# Patient Record
Sex: Male | Born: 1975 | Race: White | Hispanic: No | Marital: Married | Smoking: Never smoker
Health system: Southern US, Community
[De-identification: ages and names within clinical notes are randomized; demographics above are authoritative.]

## PROBLEM LIST (undated history)

## (undated) DIAGNOSIS — K5792 Diverticulitis of intestine, part unspecified, without perforation or abscess without bleeding: Secondary | ICD-10-CM

## (undated) HISTORY — PX: HERNIA REPAIR: SHX51

---

## 2005-05-03 ENCOUNTER — Ambulatory Visit: Payer: Self-pay | Admitting: General Surgery

## 2009-06-15 ENCOUNTER — Ambulatory Visit: Payer: Self-pay | Admitting: Otolaryngology

## 2009-06-29 ENCOUNTER — Ambulatory Visit: Payer: Self-pay | Admitting: Otolaryngology

## 2010-04-27 DEATH — deceased

## 2011-03-26 IMAGING — CT CT NECK WITHOUT AND WITH CONTRAST
2 of 3 series · 8 of 14 positions shown, 9 images · non-contrast
Comparison: none

REASON FOR EXAM: LEFT SUBMANDIBULAR GLAND STONE
COMMENTS:

[Series 2: soft tissue · axial · 0.51mm/px · z∈[-239,-50]mm · 4 of 105 slices shown, 5 images (1 of 2)]
[im 21/105  soft-tissue]
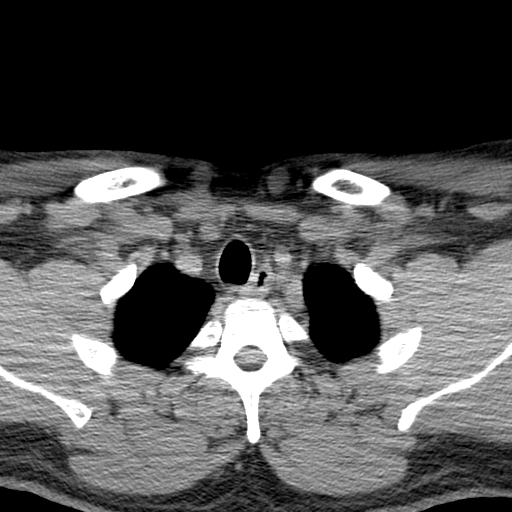
[im 21/105  bone]
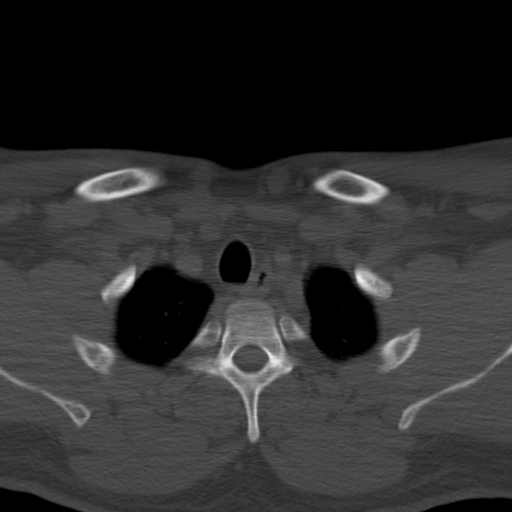
[im 42/105  bone]
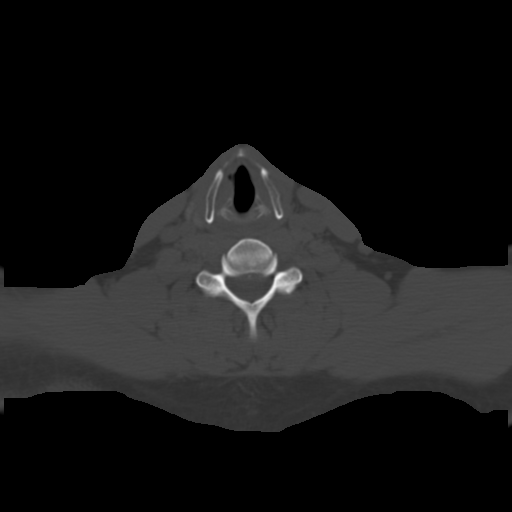
[im 63/105  bone]
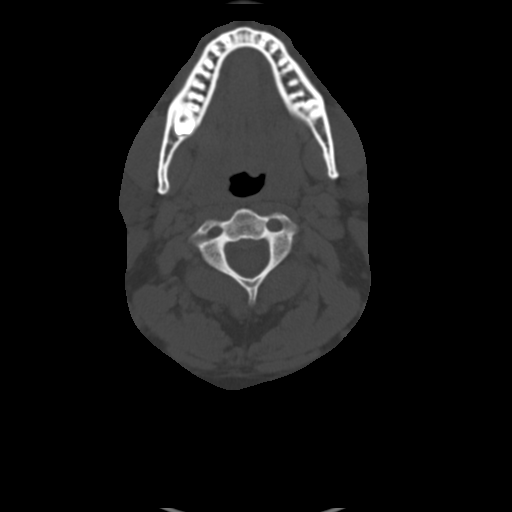
[im 84/105  bone]
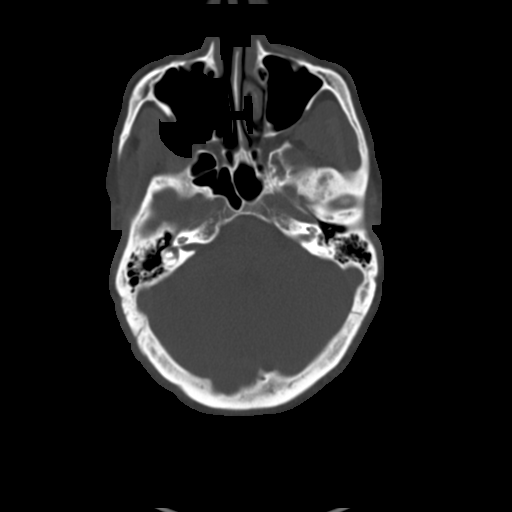

[Series 3: soft tissue · axial · 0.51mm/px · z∈[-239,-50]mm · 4 of 105 slices shown (2 of 2)]
[im 21/105  soft-tissue]
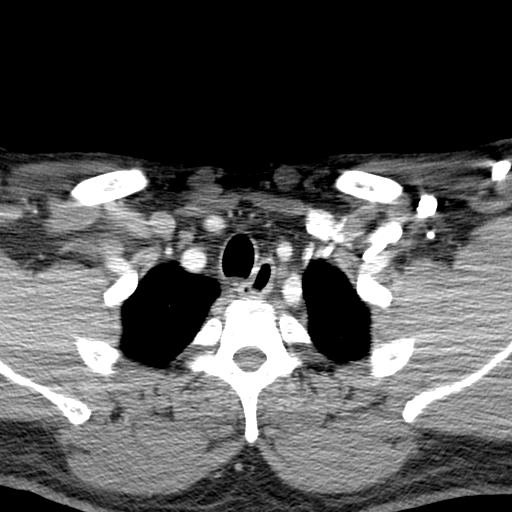
[im 42/105  soft-tissue]
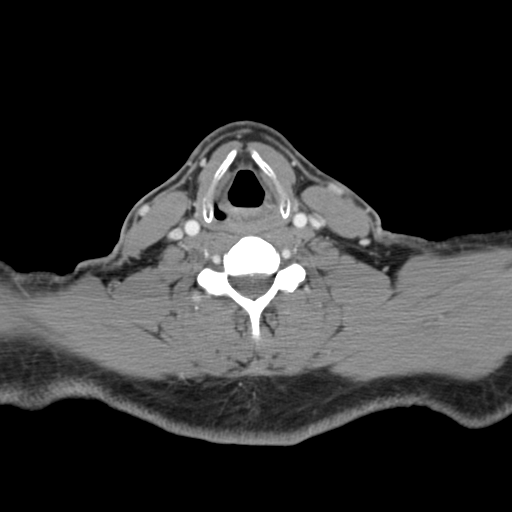
[im 63/105  soft-tissue]
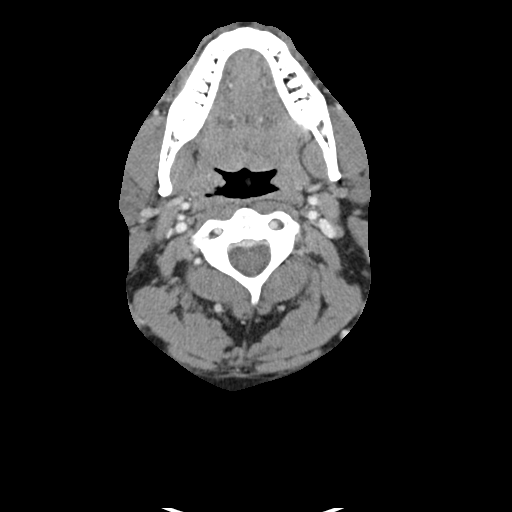
[im 84/105  soft-tissue]
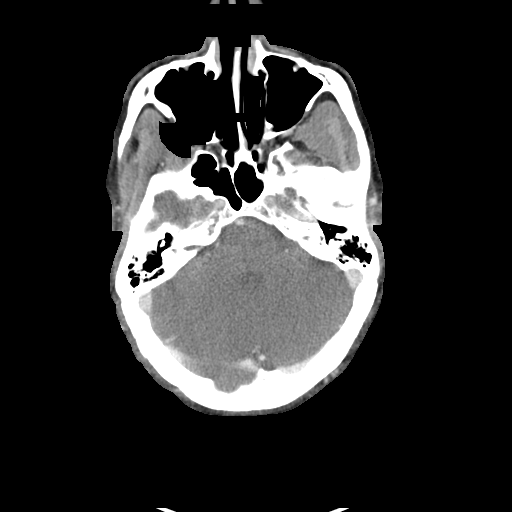

[8 of 14 positions shown; findings below may reference images not displayed]

PROCEDURE:     CT  - CT NECK W/WO  - June 15, 2009 [DATE]

RESULT:     Pre- and postcontrast CT of the neck is performed. The patient
received 75 mL of Osovue-V2F iodinated intravenous contrast for the exam.
There is no previous study for comparison. Images are reconstructed at 3 mm
slice thickness in the axial plane.

There are changes of bilateral sialolithiasis in the submandibular glands.
Stone on the right measures up to 7.2 mm. The largest continual calcific
density on the left measures up to 1.38 cm on image 47 with a second smaller
calcification along the inferior posterior aspect. Some additional punctate
calcifications are seen in the parapharyngeal regions on image 37 as well as
image 36 and images 38 and 39. The sinuses show normal aeration. The base of
the brain is unremarkable. The orbits appear within normal limits. There is
opacification of the left frontal sinus. No adenopathy is evident. The
thyroid lobes enhance homogeneously. The superior mediastinum and lung
apices appear to be unremarkable.
IMPRESSION: 1. Opacification of the left frontal sinus. Correlate for chronic sinus
disease or sinusitis.
2. Sialolithiasis bilaterally in the submandibular glands.

## 2017-10-31 ENCOUNTER — Ambulatory Visit
Admission: EM | Admit: 2017-10-31 | Discharge: 2017-10-31 | Disposition: A | Discharge status: Home / Self Care | Payer: PRIVATE HEALTH INSURANCE | Attending: Family Medicine | Admitting: Family Medicine

## 2017-10-31 ENCOUNTER — Encounter: Payer: Self-pay | Admitting: Emergency Medicine

## 2017-10-31 ENCOUNTER — Other Ambulatory Visit: Payer: Self-pay

## 2017-10-31 ENCOUNTER — Ambulatory Visit (INDEPENDENT_AMBULATORY_CARE_PROVIDER_SITE_OTHER): Payer: PRIVATE HEALTH INSURANCE

## 2017-10-31 DIAGNOSIS — J209 Acute bronchitis, unspecified: Secondary | ICD-10-CM | POA: Diagnosis not present

## 2017-10-31 DIAGNOSIS — R05 Cough: Secondary | ICD-10-CM

## 2017-10-31 HISTORY — DX: Diverticulitis of intestine, part unspecified, without perforation or abscess without bleeding: K57.92

## 2017-10-31 MED ORDER — HYDROCOD POLST-CPM POLST ER 10-8 MG/5ML PO SUER: 5.0000 mL | Freq: Two times a day (BID) | ORAL | 0 refills | Status: DC

## 2017-10-31 MED ORDER — ALBUTEROL SULFATE HFA 108 (90 BASE) MCG/ACT IN AERS: 1.0000 | INHALATION_SPRAY | Freq: Four times a day (QID) | RESPIRATORY_TRACT | 0 refills | Status: DC | PRN

## 2017-10-31 MED ORDER — IPRATROPIUM-ALBUTEROL 0.5-2.5 (3) MG/3ML IN SOLN
3.0000 mL | Freq: Once | RESPIRATORY_TRACT | Status: AC
  Administered 2017-10-31: 3 mL via RESPIRATORY_TRACT

## 2017-10-31 MED ORDER — BENZONATATE 200 MG PO CAPS: ORAL_CAPSULE | ORAL | 0 refills | Status: DC

## 2017-10-31 MED ORDER — PREDNISONE 20 MG PO TABS: ORAL_TABLET | ORAL | 0 refills | Status: DC

## 2017-10-31 NOTE — ED Triage Notes (Signed)
Patient c/o cough and chest congestion for 3 days.  

## 2017-10-31 NOTE — ED Provider Notes (Signed)
[facility]    CSN: [id number] Arrival date & time: 10/31/17  1106     History   Chief Complaint Chief Complaint  Patient presents with  . Cough    HPI Justin Pena is a 42 y.o. male.   HPI  43 year old male presents with cough and chest congestion that he has had for 3 days.  States that it came on suddenly.  He has tightness in his chest and has not been able to produce significant sputum.  Complaining of postnasal drip.  Is been having wheezing that is audible.  She did not sleep at all last night because of the amount of coughing that he was experiencing.  He has had no fever or chills.  Is a previous smoker that has not smoked in several years.            Past Medical History:  Diagnosis Date  . Diverticulitis     There are no active problems to display for this patient.   Past Surgical History:  Procedure Laterality Date  . HERNIA REPAIR         Home Medications    Prior to Admission medications   Medication Sig Start Date End Date Taking? Authorizing Provider  albuterol (PROVENTIL HFA;VENTOLIN HFA) 108 (90 Base) MCG/ACT inhaler Inhale 1-2 puffs into the lungs every 6 (six) hours as needed for wheezing or shortness of breath. Use with spacer 10/31/17   Lutricia Feil, PA-C  benzonatate (TESSALON) 200 MG capsule Take one cap TID PRN cough 10/31/17   Lutricia Feil, PA-C  chlorpheniramine-HYDROcodone [facility]) 10-8 MG/5ML SUER Take 5 mLs by mouth 2 (two) times daily. 10/31/17   Lutricia Feil, PA-C  predniSONE (DELTASONE) 20 MG tablet Take 2 tablets (40 mg) daily by mouth 10/31/17   Lutricia Feil, PA-C    Family History History reviewed. No pertinent family history.  Social History Social History   Tobacco Use  . Smoking status: Never Smoker  . Smokeless tobacco: Former Engineer, water Use Topics  . Alcohol use: Not Currently  . Drug use: Not on file     Allergies   Patient has no known  allergies.   Review of Systems Review of Systems  Constitutional: Positive for activity change. Negative for appetite change, chills, fatigue and fever.  HENT: Positive for postnasal drip.   Respiratory: Positive for cough, shortness of breath and wheezing.   All other systems reviewed and are negative.    Physical Exam Triage Vital Signs ED Triage Vitals  Enc Vitals Group     BP 10/31/17 1130 (!) 158/92     Pulse Rate 10/31/17 1130 71     Resp 10/31/17 1130 16     Temp 10/31/17 1130 97.7 F (36.5 C)     Temp Source 10/31/17 1130 Oral     SpO2 10/31/17 1130 98 %     Weight 10/31/17 1128 280 lb (127 kg)     Height 10/31/17 1128 6\' 4"  (1.93 m)     Head Circumference --      Peak Flow --      Pain Score 10/31/17 1127 5     Pain Loc --      Pain Edu? --      Excl. in GC? --    No data found.  Updated Vital Signs BP (!) 158/92 (BP Location: Left Arm)   Pulse 71   Temp 97.7 F (36.5 C) (Oral)   Resp 16  Ht 6\' 4"  (1.93 m)   Wt 280 lb (127 kg)   SpO2 98%   BMI 34.08 kg/m   Visual Acuity Right Eye Distance:   Left Eye Distance:   Bilateral Distance:    Right Eye Near:   Left Eye Near:    Bilateral Near:     Physical Exam  Constitutional: He is oriented to person, place, and time. He appears well-developed and well-nourished. No distress.  HENT:  Head: Normocephalic.  Right Ear: External ear normal.  Left Ear: External ear normal.  Nose: Nose normal.  Mouth/Throat: Oropharynx is clear and moist. No oropharyngeal exudate.  Eyes: Pupils are equal, round, and reactive to light. Right eye exhibits no discharge. Left eye exhibits no discharge.  Neck: Normal range of motion.  Pulmonary/Chest: Effort normal. He has wheezes. He has rales.  Musculoskeletal: Normal range of motion.  Lymphadenopathy:    He has no cervical adenopathy.  Neurological: He is alert and oriented to person, place, and time.  Skin: Skin is warm and dry. He is not diaphoretic.  Psychiatric:  He has a normal mood and affect. His behavior is normal. Judgment and thought content normal.  Nursing note and vitals reviewed.    UC Treatments / Results  Labs (all labs ordered are listed, but only abnormal results are displayed) Labs Reviewed - No data to display  EKG None  Radiology Dg Chest 2 View  Result Date: 10/31/2017 CLINICAL DATA:  Cough and chest congestion for the past 3 days. EXAM: CHEST - 2 VIEW COMPARISON:  None. FINDINGS: Normal sized heart. Clear lungs. Mild central peribronchial thickening. Mild thoracic spine degenerative changes. IMPRESSION: Mild bronchitic changes. Electronically Signed   By: Beckie Salts M.D.   On: 10/31/2017 12:50    Procedures Procedures (including critical care time)  Medications Ordered in UC Medications  ipratropium-albuterol (DUONEB) 0.5-2.5 (3) MG/3ML nebulizer solution 3 mL (3 mLs Nebulization Given 10/31/17 1211)    Initial Impression / Assessment and Plan / UC Course  I have reviewed the triage vital signs and the nursing notes.  Pertinent labs & imaging results that were available during my care of the patient were reviewed by me and considered in my medical decision making (see chart for details).     Plan: 1. Test/x-ray results and diagnosis reviewed with patient 2. rx as per orders; risks, benefits, potential side effects reviewed with patient 3. Recommend supportive treatment with use of cough suppressants.  Will prescribe 1 course of prednisone.  Edition I have supplied him with albuterol inhaler that he will use as necessary for wheezing or coughing.  Advised him this is likely a viral illness does not require antibiotics at this time.  However if he continues to have problems or worsens he should return to our clinic particularly if he begins running fevers or has a productive cough not responding to conservative care. 4. F/u prn if symptoms worsen or don't improve  Final Clinical Impressions(s) / UC Diagnoses   Final  diagnoses:  Acute bronchitis, unspecified organism   Discharge Instructions   None    ED Prescriptions    Medication Sig Dispense Auth. Provider   predniSONE (DELTASONE) 20 MG tablet Take 2 tablets (40 mg) daily by mouth 8 tablet Lutricia Feil, PA-C   benzonatate (TESSALON) 200 MG capsule Take one cap TID PRN cough 30 capsule Lutricia Feil, PA-C   chlorpheniramine-HYDROcodone (TUSSIONEX PENNKINETIC ER) 10-8 MG/5ML SUER Take 5 mLs by mouth 2 (two) times daily. 115 mL  Lutricia Feil, PA-C   albuterol (PROVENTIL HFA;VENTOLIN HFA) 108 (90 Base) MCG/ACT inhaler Inhale 1-2 puffs into the lungs every 6 (six) hours as needed for wheezing or shortness of breath. Use with spacer 1 Inhaler Lutricia Feil, PA-C     Controlled Substance Prescriptions [place] Controlled Substance Registry consulted? Not Applicable   Lutricia Feil, PA-C 10/31/17 2013

## 2021-01-24 ENCOUNTER — Other Ambulatory Visit: Payer: Self-pay

## 2021-01-24 ENCOUNTER — Ambulatory Visit
Admission: EM | Admit: 2021-01-24 | Discharge: 2021-01-24 | Disposition: A | Discharge status: Home / Self Care | Payer: Managed Care, Other (non HMO)

## 2021-01-24 ENCOUNTER — Encounter: Payer: Self-pay | Admitting: Emergency Medicine

## 2021-01-24 DIAGNOSIS — J029 Acute pharyngitis, unspecified: Secondary | ICD-10-CM

## 2021-01-24 DIAGNOSIS — J069 Acute upper respiratory infection, unspecified: Secondary | ICD-10-CM

## 2021-01-24 MED ORDER — AMOXICILLIN-POT CLAVULANATE 875-125 MG PO TABS: 1.0000 | ORAL_TABLET | Freq: Two times a day (BID) | ORAL | 0 refills | Status: AC

## 2021-01-24 MED ORDER — PROMETHAZINE-DM 6.25-15 MG/5ML PO SYRP: 5.0000 mL | ORAL_SOLUTION | Freq: Four times a day (QID) | ORAL | 0 refills | Status: AC | PRN

## 2021-01-24 MED ORDER — BENZONATATE 100 MG PO CAPS: 200.0000 mg | ORAL_CAPSULE | Freq: Three times a day (TID) | ORAL | 0 refills | Status: AC

## 2021-01-24 MED ORDER — IPRATROPIUM BROMIDE 0.06 % NA SOLN: 2.0000 | Freq: Four times a day (QID) | NASAL | 12 refills | Status: AC

## 2021-01-24 NOTE — Discharge Instructions (Signed)
Take the Augmentin twice daily for 10 days for treatment of your upper respiratory infection.  Gargle with warm salt water 2-3 times a day to soothe your throat, aid in pain relief, and aid in healing.  Take over-the-counter ibuprofen according to the package instructions as needed for pain.  You can also use Chloraseptic or Sucrets lozenges, 1 lozenge every 2 hours as needed for throat pain.  Use the Atrovent nasal spray, 2 squirts in each nostril every 6 hours, as needed for runny nose and postnasal drip.  Use the Tessalon Perles every 8 hours during the day.  Take them with a small sip of water.  They may give you some numbness to the base of your tongue or a metallic taste in your mouth, this is normal.  Use the Promethazine DM cough syrup at bedtime for cough and congestion.  It will make you drowsy so do not take it during the day.  Return for reevaluation or see your primary care provider for any new or worsening symptoms.

## 2021-01-24 NOTE — ED Triage Notes (Addendum)
Pt presents today with c/o sore throat, cough, nasal congestion x 8 days. Low grade temp reported. Home Covid test taken on Wednesday, neg. Took Ibuprofen 600 mg at 7:00 am this morning.

## 2021-01-24 NOTE — ED Provider Notes (Signed)
[facility]    CSN: [id number] Arrival date & time: 01/24/21  9509      History   Chief Complaint Chief Complaint  Patient presents with   Sore Throat   Nasal Congestion   Cough    HPI Justin Pena is a 45 y.o. male.   HPI  63 old male here for evaluation of respiratory symptoms.  Patient reports that for the last 3 days he has been experiencing sore throat with nasal congestion and green thick nasal discharge.  He is also experiencing pressure in his sinuses and pain in both ears.  He has had a headache and he is continue to run fevers with a temperature of around 100-100.1.  Last time he had a fever was last night.  He states that he is having decreased sleep secondary to coughing as well.  Patient has taken home COVID test was negative.  He denies any body aches or GI complaints.  Past Medical History:  Diagnosis Date   Diverticulitis     There are no problems to display for this patient.   Past Surgical History:  Procedure Laterality Date   HERNIA REPAIR         Home Medications    Prior to Admission medications   Medication Sig Start Date End Date Taking? Authorizing Provider  amoxicillin-clavulanate (AUGMENTIN) 875-125 MG tablet Take 1 tablet by mouth every 12 (twelve) hours for 10 days. 01/24/21 02/03/21 Yes Becky Augusta, NP  benzonatate (TESSALON) 100 MG capsule Take 2 capsules (200 mg total) by mouth every 8 (eight) hours. 01/24/21  Yes Becky Augusta, NP  ipratropium (ATROVENT) 0.06 % nasal spray Place 2 sprays into both nostrils 4 (four) times daily. 01/24/21  Yes Becky Augusta, NP  promethazine-dextromethorphan (PROMETHAZINE-DM) 6.25-15 MG/5ML syrup Take 5 mLs by mouth 4 (four) times daily as needed. 01/24/21  Yes Becky Augusta, NP  propranolol (INDERAL) 40 MG tablet Take 40 mg by mouth daily as needed. 01/12/21   [provider]    Family History History reviewed. No pertinent family history.  Social History Social History    Tobacco Use   Smoking status: Never   Smokeless tobacco: Former  Substance Use Topics   Alcohol use: Not Currently     Allergies   Patient has no known allergies.   Review of Systems Review of Systems  Constitutional:  Positive for fever. Negative for activity change and appetite change.  HENT:  Positive for congestion, ear pain, postnasal drip, rhinorrhea, sinus pressure and sore throat.   Respiratory:  Positive for cough. Negative for shortness of breath and wheezing.   Gastrointestinal:  Negative for diarrhea, nausea and vomiting.  Musculoskeletal:  Negative for arthralgias and myalgias.  Skin:  Negative for rash.  Neurological:  Positive for headaches.  Hematological: Negative.   Psychiatric/Behavioral: Negative.      Physical Exam Triage Vital Signs ED Triage Vitals  Enc Vitals Group     BP 01/24/21 0923 (!) 155/88     Pulse Rate 01/24/21 0923 81     Resp 01/24/21 0923 20     Temp 01/24/21 0923 98.7 F (37.1 C)     Temp Source 01/24/21 0923 Oral     SpO2 01/24/21 0923 97 %     Weight --      Height --      Head Circumference --      Peak Flow --      Pain Score 01/24/21 0927 5     Pain  Loc --      Pain Edu? --      Excl. in GC? --    No data found.  Updated Vital Signs BP (!) 155/88 (BP Location: Left Arm)   Pulse 81   Temp 98.7 F (37.1 C) (Oral)   Resp 20   SpO2 97%   Visual Acuity Right Eye Distance:   Left Eye Distance:   Bilateral Distance:    Right Eye Near:   Left Eye Near:    Bilateral Near:     Physical Exam Vitals and nursing note reviewed.  Constitutional:      General: He is not in acute distress.    Appearance: Normal appearance. He is ill-appearing.  HENT:     Head: Normocephalic and atraumatic.     Right Ear: Tympanic membrane, ear canal and external ear normal. There is no impacted cerumen.     Left Ear: Tympanic membrane, ear canal and external ear normal. There is no impacted cerumen.     Nose: Congestion and  rhinorrhea present.     Comments: Patient is purulent discharge in both naris and tenderness to percussion of both frontal and maxillary sinuses bilaterally.    Mouth/Throat:     Mouth: Mucous membranes are moist.     Pharynx: Oropharynx is clear. Posterior oropharyngeal erythema present.  Cardiovascular:     Rate and Rhythm: Normal rate and regular rhythm.     Pulses: Normal pulses.     Heart sounds: Normal heart sounds. No murmur heard.   No gallop.  Pulmonary:     Effort: Pulmonary effort is normal.     Breath sounds: Normal breath sounds. No wheezing, rhonchi or rales.  Musculoskeletal:     Cervical back: Normal range of motion and neck supple.  Lymphadenopathy:     Cervical: No cervical adenopathy.  Skin:    General: Skin is warm and dry.     Capillary Refill: Capillary refill takes less than 2 seconds.     Findings: No erythema or rash.  Neurological:     General: No focal deficit present.     Mental Status: He is alert and oriented to person, place, and time.  Psychiatric:        Mood and Affect: Mood normal.        Behavior: Behavior normal.        Thought Content: Thought content normal.        Judgment: Judgment normal.     UC Treatments / Results  Labs (all labs ordered are listed, but only abnormal results are displayed) Labs Reviewed - No data to display  EKG   Radiology No results found.  Procedures Procedures (including critical care time)  Medications Ordered in UC Medications - No data to display  Initial Impression / Assessment and Plan / UC Course  I have reviewed the triage vital signs and the nursing notes.  Pertinent labs & imaging results that were available during my care of the patient were reviewed by me and considered in my medical decision making (see chart for details).  Patient is a pleasant, though ill-appearing, 45 old male here for evaluation of respiratory complaints as outlined in HPI above.  The symptoms been going on for 8  days the patient is continuing to run a fever.  He is also experiencing purulent discharge from both nares and sinus pressure.  Physical exam reveals pearly gray tympanic membranes bilaterally with normal light reflex and clear external auditory canals.  Nasal mucosa  is erythematous and edematous with purulent discharge in both nares.  Frontal and maxillary sinuses bilaterally are tender to percussion.  Oropharyngeal exam reveals posterior oropharyngeal erythema with green postnasal drip.  No cervical lymphadenopathy appreciated on exam.  Cardiopulmonary exam shows clear lung sounds in all fields.  Patient exam is consistent with upper story infection.  There is concern that given the duration of symptoms and the fact he is continue to run fevers that this is now bacterial rather than viral.  We will treat patient as such with Augmentin twice daily for 10 days, Atrovent nasal spray, Tessalon Perles, and Promethazine DM cough syrup to help with cough and congestion.  Work note provided.   Final Clinical Impressions(s) / UC Diagnoses   Final diagnoses:  Upper respiratory tract infection, unspecified type  Pharyngitis, unspecified etiology     Discharge Instructions      Take the Augmentin twice daily for 10 days for treatment of your upper respiratory infection.  Gargle with warm salt water 2-3 times a day to soothe your throat, aid in pain relief, and aid in healing.  Take over-the-counter ibuprofen according to the package instructions as needed for pain.  You can also use Chloraseptic or Sucrets lozenges, 1 lozenge every 2 hours as needed for throat pain.  Use the Atrovent nasal spray, 2 squirts in each nostril every 6 hours, as needed for runny nose and postnasal drip.  Use the Tessalon Perles every 8 hours during the day.  Take them with a small sip of water.  They may give you some numbness to the base of your tongue or a metallic taste in your mouth, this is normal.  Use the  Promethazine DM cough syrup at bedtime for cough and congestion.  It will make you drowsy so do not take it during the day.  Return for reevaluation or see your primary care provider for any new or worsening symptoms.       ED Prescriptions     Medication Sig Dispense Auth. Provider   amoxicillin-clavulanate (AUGMENTIN) 875-125 MG tablet Take 1 tablet by mouth every 12 (twelve) hours for 10 days. 20 tablet Becky Augusta, NP   benzonatate (TESSALON) 100 MG capsule Take 2 capsules (200 mg total) by mouth every 8 (eight) hours. 21 capsule Becky Augusta, NP   ipratropium (ATROVENT) 0.06 % nasal spray Place 2 sprays into both nostrils 4 (four) times daily. 15 mL Becky Augusta, NP   promethazine-dextromethorphan (PROMETHAZINE-DM) 6.25-15 MG/5ML syrup Take 5 mLs by mouth 4 (four) times daily as needed. 118 mL Becky Augusta, NP      PDMP not reviewed this encounter.   Becky Augusta, NP 01/24/21 1042

## 2021-09-29 ENCOUNTER — Ambulatory Visit
Admission: EM | Admit: 2021-09-29 | Discharge: 2021-09-29 | Disposition: A | Discharge status: Home / Self Care | Payer: Managed Care, Other (non HMO) | Attending: Physician Assistant | Admitting: Physician Assistant

## 2021-09-29 DIAGNOSIS — Z23 Encounter for immunization: Secondary | ICD-10-CM | POA: Diagnosis not present

## 2021-09-29 DIAGNOSIS — S51811A Laceration without foreign body of right forearm, initial encounter: Secondary | ICD-10-CM

## 2021-09-29 MED ORDER — TETANUS-DIPHTH-ACELL PERTUSSIS 5-2.5-18.5 LF-MCG/0.5 IM SUSY
0.5000 mL | PREFILLED_SYRINGE | Freq: Once | INTRAMUSCULAR | Status: AC
  Administered 2021-09-29: 0.5 mL via INTRAMUSCULAR

## 2021-09-29 NOTE — Discharge Instructions (Addendum)
Leave the dressing in place for the next 24 hours.  After 24 hours remove the dressing, wash the wound with warm water and soap, pat it dry, and apply a thin smear of antibiotic ointment.  Clean the wound daily and apply antibiotic ointment for the first 2 days.  After that a scab should have started to form and you can leave the wound open to air when you are at home and cover with a bandage when you go out in public.  Sutures remain in place for 10 days, please return here or see your primary care provider for removal.  If you develop any redness at the wound site, swelling, pain, drainage, red streaks going up your hand, or fever please return for reevaluation.

## 2021-09-29 NOTE — ED Provider Notes (Signed)
[facility]    CSN: [id number] Arrival date & time: 09/29/21  1633      History   Chief Complaint Chief Complaint  Patient presents with   Laceration    HPI Justin Pena is a 46 y.o. male.   Patient is a 46 year old male who presents with chief complaint of laceration to his right forearm.  Patient states he was carrying groceries into the house when he tripped, falling and hitting his forearm on a metal table.  Patient states injury happened about 2 hours prior to arrival.  Patient is unsure when his last tetanus shot was, states maybe 4 years ago.  He does report he is going fishing this weekend.    Past Medical History:  Diagnosis Date   Diverticulitis     There are no problems to display for this patient.   Past Surgical History:  Procedure Laterality Date   HERNIA REPAIR         Home Medications    Prior to Admission medications   Medication Sig Start Date End Date Taking? Authorizing Provider  ipratropium (ATROVENT) 0.06 % nasal spray Place 2 sprays into both nostrils 4 (four) times daily. 01/24/21  Yes Becky Augusta, NP  propranolol (INDERAL) 40 MG tablet Take 40 mg by mouth daily as needed. 01/12/21  Yes [provider]  benzonatate (TESSALON) 100 MG capsule Take 2 capsules (200 mg total) by mouth every 8 (eight) hours. 01/24/21   Becky Augusta, NP  promethazine-dextromethorphan (PROMETHAZINE-DM) 6.25-15 MG/5ML syrup Take 5 mLs by mouth 4 (four) times daily as needed. 01/24/21   Becky Augusta, NP    Family History History reviewed. No pertinent family history.  Social History Social History   Tobacco Use   Smoking status: Never   Smokeless tobacco: Current  Vaping Use   Vaping Use: Never used  Substance Use Topics   Alcohol use: Yes   Drug use: Never     Allergies   Patient has no known allergies.   Review of Systems Review of Systems as noted above in HPI.  Other systems reviewed and found to be negative.   Physical  Exam Triage Vital Signs ED Triage Vitals  Enc Vitals Group     BP 09/29/21 1703 (!) 145/84     Pulse Rate 09/29/21 1703 72     Resp 09/29/21 1703 18     Temp 09/29/21 1703 98.7 F (37.1 C)     Temp Source 09/29/21 1703 Oral     SpO2 09/29/21 1703 98 %     Weight 09/29/21 1701 275 lb (124.7 kg)     Height 09/29/21 1701 6\' 4"  (1.93 m)     Head Circumference --      Peak Flow --      Pain Score 09/29/21 1658 2     Pain Loc --      Pain Edu? --      Excl. in GC? --    No data found.  Updated Vital Signs BP (!) 145/84 (BP Location: Left Arm)   Pulse 72   Temp 98.7 F (37.1 C) (Oral)   Resp 18   Ht 6\' 4"  (1.93 m)   Wt 275 lb (124.7 kg)   SpO2 98%   BMI 33.47 kg/m   Visual Acuity Right Eye Distance:   Left Eye Distance:   Bilateral Distance:    Right Eye Near:   Left Eye Near:    Bilateral Near:     Physical Exam  Constitutional:      Appearance: Normal appearance.  Skin:      Neurological:     Mental Status: He is alert.        UC Treatments / Results  Labs (all labs ordered are listed, but only abnormal results are displayed) Labs Reviewed - No data to display  EKG   Radiology No results found.  Procedures Laceration Repair  Date/Time: 09/29/2021 6:01 PM  Performed by: Candis Schatz, PA-C Authorized by: Candis Schatz, PA-C   Consent:    Consent obtained:  Verbal   Consent given by:  Patient Anesthesia:    Anesthesia method:  Local infiltration   Local anesthetic:  Lidocaine 1% WITH epi Laceration details:    Location:  Shoulder/arm   Shoulder/arm location:  R lower arm Pre-procedure details:    Preparation:  Patient was prepped and draped in usual sterile fashion Exploration:    Hemostasis achieved with:  Epinephrine and direct pressure Treatment:    Area cleansed with:  Chlorhexidine   Amount of cleaning:  Standard   Visualized foreign bodies/material removed: no   Skin repair:    Repair method:  Sutures and staples  (staples used for majority of closure. suture for area of Y laceration)   Suture size:  5-0   Suture material:  Prolene   Suture technique:  Simple interrupted   Number of sutures:  3   Number of staples:  10 Approximation:    Approximation:  Close Repair type:    Repair type:  Simple Post-procedure details:    Dressing:  Antibiotic ointment and non-adherent dressing   Procedure completion:  Tolerated  (including critical care time)  Medications Ordered in UC Medications  Tdap (BOOSTRIX) injection 0.5 mL (0.5 mLs Intramuscular Given 09/29/21 1757)    Initial Impression / Assessment and Plan / UC Course  I have reviewed the triage vital signs and the nursing notes.  Pertinent labs & imaging results that were available during my care of the patient were reviewed by me and considered in my medical decision making (see chart for details).    Patient with laceration to his right forearm after falling and hitting it on a metal table.  Images above.  Simple closure with staples and suture.  Patient given instructions for wound care and return for any signs of infection.  Patient reports he is going on a fishing trip tomorrow.  Recommend he remove the Coban and placed a nonadherent dressing and then cover that with a occlusive dressing to keep the wound dry for the next 24 hours.  Sutures out 10 days.  Patient unsure of last tetanus shot, given today in clinic.  Final Clinical Impressions(s) / UC Diagnoses   Final diagnoses:  Laceration of right forearm, initial encounter     Discharge Instructions      Leave the dressing in place for the next 24 hours.  After 24 hours remove the dressing, wash the wound with warm water and soap, pat it dry, and apply a thin smear of antibiotic ointment.  Clean the wound daily and apply antibiotic ointment for the first 2 days.  After that a scab should have started to form and you can leave the wound open to air when you are at home and cover with a  bandage when you go out in public.  Sutures remain in place for 10 days, please return here or see your primary care provider for removal.  If you develop any redness at the  wound site, swelling, pain, drainage, red streaks going up your hand, or fever please return for reevaluation.      ED Prescriptions   None    PDMP not reviewed this encounter.   Candis Schatz, PA-C 09/29/21 [phone]

## 2021-09-29 NOTE — ED Triage Notes (Signed)
Pt c/o cut to right forearm. Pt states that he tripped and fell against a metal table.  Pt states that he fell at 3:15pm.

## 2021-10-12 ENCOUNTER — Ambulatory Visit
Admission: EM | Admit: 2021-10-12 | Discharge: 2021-10-12 | Disposition: A | Discharge status: Home / Self Care | Payer: Managed Care, Other (non HMO) | Attending: Family Medicine | Admitting: Family Medicine

## 2021-10-12 ENCOUNTER — Encounter: Payer: Self-pay | Admitting: Emergency Medicine

## 2021-10-12 DIAGNOSIS — L03113 Cellulitis of right upper limb: Secondary | ICD-10-CM | POA: Diagnosis not present

## 2021-10-12 DIAGNOSIS — S51811D Laceration without foreign body of right forearm, subsequent encounter: Secondary | ICD-10-CM | POA: Diagnosis not present

## 2021-10-12 DIAGNOSIS — Z4802 Encounter for removal of sutures: Secondary | ICD-10-CM

## 2021-10-12 MED ORDER — CEPHALEXIN 500 MG PO CAPS: 500.0000 mg | ORAL_CAPSULE | Freq: Three times a day (TID) | ORAL | 0 refills | Status: AC

## 2021-10-12 NOTE — Discharge Instructions (Addendum)
There is definitely concerned that you are developing some mild cellulitis, or soft tissue skin infection, as result of the wound you sustained on your right forearm.  Please take the Keflex 3 times a day for 7 days for treatment of that infection.  Continue to keep your wound clean and dry and avoid any excessive stress as the tissues are still healing and we do not want the wound, open.  If you develop any increased swelling, redness, pain, red streaks going up your arm, or fever please return for reevaluation.

## 2021-10-12 NOTE — ED Triage Notes (Signed)
Pt presents for suture and staple removal on right forearm. States last few days pt noticed forearm to be red and hot to touch.

## 2021-10-12 NOTE — ED Provider Notes (Signed)
[facility]    CSN: [id number] Arrival date & time: 10/12/21  1231      History   Chief Complaint No chief complaint on file.   HPI Justin Pena is a 46 y.o. male.   HPI  46 year old male here for suture and staple removal.  Patient was evaluated in this urgent care 14 days ago and had a series of sutures and staples placed in a laceration on his right proximal forearm.  He states that he followed the home care instructions and everything was fine until approximately a day and a half ago when the area surrounding the wound became tender, hot, and red.  Patient has not had a fever and he denies any drainage from the wound.  He has been cleaning the wound with hydrogen peroxide.  Past Medical History:  Diagnosis Date   Diverticulitis     There are no problems to display for this patient.   Past Surgical History:  Procedure Laterality Date   HERNIA REPAIR         Home Medications    Prior to Admission medications   Medication Sig Start Date End Date Taking? Authorizing Provider  cephALEXin (KEFLEX) 500 MG capsule Take 1 capsule (500 mg total) by mouth 3 (three) times daily for 7 days. 10/12/21 10/19/21 Yes Becky Augusta, NP  benzonatate (TESSALON) 100 MG capsule Take 2 capsules (200 mg total) by mouth every 8 (eight) hours. 01/24/21   Becky Augusta, NP  ipratropium (ATROVENT) 0.06 % nasal spray Place 2 sprays into both nostrils 4 (four) times daily. 01/24/21   Becky Augusta, NP  promethazine-dextromethorphan (PROMETHAZINE-DM) 6.25-15 MG/5ML syrup Take 5 mLs by mouth 4 (four) times daily as needed. 01/24/21   Becky Augusta, NP  propranolol (INDERAL) 40 MG tablet Take 40 mg by mouth daily as needed. 01/12/21   [provider]    Family History No family history on file.  Social History Social History   Tobacco Use   Smoking status: Never   Smokeless tobacco: Current  Vaping Use   Vaping Use: Never used  Substance Use Topics   Alcohol use: Yes    Drug use: Never     Allergies   Patient has no known allergies.   Review of Systems Review of Systems  Constitutional:  Negative for fever.  Skin:  Positive for color change and wound.     Physical Exam Triage Vital Signs ED Triage Vitals  Enc Vitals Group     BP      Pulse      Resp      Temp      Temp src      SpO2      Weight      Height      Head Circumference      Peak Flow      Pain Score      Pain Loc      Pain Edu?      Excl. in GC?    No data found.  Updated Vital Signs There were no vitals taken for this visit.  Visual Acuity Right Eye Distance:   Left Eye Distance:   Bilateral Distance:    Right Eye Near:   Left Eye Near:    Bilateral Near:     Physical Exam Vitals and nursing note reviewed.  Constitutional:      Appearance: Normal appearance. He is not ill-appearing.  Skin:    General: Skin is warm and dry.  Capillary Refill: Capillary refill takes less than 2 seconds.     Findings: Erythema present. No lesion.  Neurological:     General: No focal deficit present.     Mental Status: He is alert and oriented to person, place, and time.  Psychiatric:        Mood and Affect: Mood normal.        Behavior: Behavior normal.        Thought Content: Thought content normal.        Judgment: Judgment normal.      UC Treatments / Results  Labs (all labs ordered are listed, but only abnormal results are displayed) Labs Reviewed - No data to display  EKG   Radiology No results found.  Procedures Procedures (including critical care time)  Medications Ordered in UC Medications - No data to display  Initial Impression / Assessment and Plan / UC Course  I have reviewed the triage vital signs and the nursing notes.  Pertinent labs & imaging results that were available during my care of the patient were reviewed by me and considered in my medical decision making (see chart for details).   Patient is a very pleasant,  nontoxic-appearing 46 year old male here for suture staple removal from laceration that was repaired on his right proximal forearm 14 days ago in this urgent care.  The patient is concerned because in the last day and a half the surrounding tissue has become erythematous, hot, and tender to touch.  The wound itself is well-healed and the sutures and staples are intact.  There are a total of 7 staples and 3 sutures in place.  The surrounding tissue is erythematous, tender, and hot.  There is no induration or fluctuance noted.  There is a little bit of yellow drainage along the seam of the healed laceration but no evidence of dehiscence.  I suspect the patient is developing cellulitis and I will discharge him home on Keflex 3 times daily x7 days.     Final Clinical Impressions(s) / UC Diagnoses   Final diagnoses:  Cellulitis of right upper extremity  Visit for suture removal  Encounter for staple removal     Discharge Instructions      There is definitely concerned that you are developing some mild cellulitis, or soft tissue skin infection, as result of the wound you sustained on your right forearm.  Please take the Keflex 3 times a day for 7 days for treatment of that infection.  Continue to keep your wound clean and dry and avoid any excessive stress as the tissues are still healing and we do not want the wound, open.  If you develop any increased swelling, redness, pain, red streaks going up your arm, or fever please return for reevaluation.     ED Prescriptions     Medication Sig Dispense Auth. Provider   cephALEXin (KEFLEX) 500 MG capsule Take 1 capsule (500 mg total) by mouth 3 (three) times daily for 7 days. 21 capsule Becky Augusta, NP      PDMP not reviewed this encounter.   Becky Augusta, NP 10/12/21 1329

## 2023-04-05 ENCOUNTER — Other Ambulatory Visit: Payer: Self-pay | Admitting: Neurology

## 2023-04-05 DIAGNOSIS — R202 Paresthesia of skin: Secondary | ICD-10-CM

## 2023-04-05 DIAGNOSIS — R200 Anesthesia of skin: Secondary | ICD-10-CM

## 2023-04-09 ENCOUNTER — Encounter: Payer: Self-pay | Admitting: Neurology

## 2023-04-19 ENCOUNTER — Other Ambulatory Visit: Payer: Managed Care, Other (non HMO)

## 2023-05-08 ENCOUNTER — Other Ambulatory Visit: Payer: Managed Care, Other (non HMO)
